# Patient Record
Sex: Male | Born: 1975 | Race: Black or African American | Hispanic: No | Marital: Married | Smoking: Never smoker
Health system: Southern US, Community
[De-identification: ages and names within clinical notes are randomized; demographics above are authoritative.]

## PROBLEM LIST (undated history)

## (undated) DIAGNOSIS — I10 Essential (primary) hypertension: Secondary | ICD-10-CM

## (undated) HISTORY — PX: NOSE SURGERY: SHX723

---

## 2015-07-16 ENCOUNTER — Emergency Department: Payer: BLUE CROSS/BLUE SHIELD

## 2015-07-16 ENCOUNTER — Emergency Department
Admission: EM | Admit: 2015-07-16 | Discharge: 2015-07-16 | Disposition: A | Discharge status: Home / Self Care | Payer: BLUE CROSS/BLUE SHIELD | Attending: Emergency Medicine | Admitting: Emergency Medicine

## 2015-07-16 ENCOUNTER — Encounter: Payer: Self-pay | Admitting: Emergency Medicine

## 2015-07-16 DIAGNOSIS — I10 Essential (primary) hypertension: Secondary | ICD-10-CM | POA: Diagnosis not present

## 2015-07-16 DIAGNOSIS — R079 Chest pain, unspecified: Secondary | ICD-10-CM

## 2015-07-16 HISTORY — DX: Essential (primary) hypertension: I10

## 2015-07-16 LAB — BASIC METABOLIC PANEL
ANION GAP: 10 (ref 5–15)
BUN: 15 mg/dL (ref 6–20)
CALCIUM: 9.4 mg/dL (ref 8.9–10.3)
CO2: 24 mmol/L (ref 22–32)
Chloride: 105 mmol/L (ref 101–111)
Creatinine, Ser: 0.82 mg/dL (ref 0.61–1.24)
GFR calc non Af Amer: >60 mL/min (ref >60)
Glucose, Bld: 98 mg/dL (ref 65–99)
POTASSIUM: 3.5 mmol/L (ref 3.5–5.1)
Sodium: 139 mmol/L (ref 135–145)

## 2015-07-16 LAB — CBC
HEMATOCRIT: 40.9 % (ref 40.0–52.0)
HEMOGLOBIN: 13.9 g/dL (ref 13.0–18.0)
MCH: 28 pg (ref 26.0–34.0)
MCHC: 33.9 g/dL (ref 32.0–36.0)
MCV: 82.5 fL (ref 80.0–100.0)
Platelets: 194 10*3/uL (ref 150–440)
RBC: 4.96 MIL/uL (ref 4.40–5.90)
RDW: 14.9 % — ABNORMAL HIGH (ref 11.5–14.5)
WBC: 7.2 10*3/uL (ref 3.8–10.6)

## 2015-07-16 LAB — TROPONIN I
TROPONIN I: 0.03 ng/mL (ref <0.031)
Troponin I: <0.03 ng/mL (ref <0.031)

## 2015-07-16 NOTE — ED Notes (Addendum)
Patient presents to the ED with left sided chest pain radiating down left arm and into left side of patient's jaw x 2 days but getting worse today per patient.  Patient denies shortness of breath.  Patient reports diaphoresis and some nausea, vomiting x 1.  Patient reports high stress over the past several months due to problems in his marriage.  Patient reports losing a lot of weight.

## 2015-07-16 NOTE — ED Provider Notes (Signed)
  Physical Exam  BP 153/95 mmHg  Pulse 69  Temp(Src) 98.7 F (37.1 C) (Oral)  Resp 18  Ht 5\' 6"  (1.676 m)  Wt 185 lb (83.915 kg)  BMI 29.87 kg/m2  SpO2 100%  Physical Exam  ED Course  Procedures  MDM Care assumed at 9 pm. Patient here with chest pain for several days. Delta trop neg. Will have him take ASA 81 mg daily and see Dr. Juliann Parescallwood outpatient.   Richardean Canalavid H Kimala Horne, MD 07/16/15 2103

## 2015-07-16 NOTE — Discharge Instructions (Signed)
Take ASA 81 mg daily,.   See cardiology outpatient.   Return to ER if you have severe chest pain, shortness of breath, numbness

## 2015-07-16 NOTE — ED Notes (Signed)
Called to check on status of labs

## 2015-07-16 NOTE — ED Provider Notes (Signed)
[facility] Emergency Department Provider Note     Time seen: ----------------------------------------- 6:53 PM on 07/16/2015 -----------------------------------------    I have reviewed the triage vital signs and the nursing notes.   HISTORY  Chief Complaint Chest Pain    HPI Dennis Santiago is a 40 y.o. male who presents to ER for left-sided chest pain and left shoulder pain. Patient is also had paresthesias radiating down his left arm for 2 days. This is gotten worse today, denies shortness of breath, but has had some diaphoresis with nausea and vomiting. He reports stress over the past several months due to problems with his marriage. Patient denies history of same, nothing makes it better or worse.   Past Medical History  Diagnosis Date  . Hypertension     There are no active problems to display for this patient.   History reviewed. No pertinent past surgical history.  Allergies Review of patient's allergies indicates no known allergies.  Social History Social History  Substance Use Topics  . Smoking status: Never Smoker   . Smokeless tobacco: None  . Alcohol Use: Yes     Comment: socially    Review of Systems Constitutional: Negative for fever. Eyes: Negative for visual changes. ENT: Negative for sore throat. Cardiovascular: Positive for chest pain Respiratory: Negative for shortness of breath. Gastrointestinal: Negative for abdominal pain, positive for nausea vomiting Genitourinary: Negative for dysuria. Musculoskeletal: Negative for back pain. Skin: Positive for diaphoresis Neurological: Negative for headaches, focal weakness or numbness.  10-point ROS otherwise negative.  ____________________________________________   PHYSICAL EXAM:  VITAL SIGNS: ED Triage Vitals  Enc Vitals Group     BP 07/16/15 1658 127/82 mmHg     Pulse Rate 07/16/15 1658 80     Resp 07/16/15 1658 20     Temp 07/16/15 1658 98.7 F (37.1 C)   Temp Source 07/16/15 1658 Oral     SpO2 07/16/15 1658 97 %     Weight 07/16/15 1658 185 lb (83.915 kg)     Height 07/16/15 1658 5\' 6"  (1.676 m)     Head Cir --      Peak Flow --      Pain Score 07/16/15 1659 7     Pain Loc --      Pain Edu? --      Excl. in GC? --    Constitutional: Alert and oriented. Well appearing and in no distress. Eyes: Conjunctivae are normal. PERRL. Normal extraocular movements. ENT   Head: Normocephalic and atraumatic.   Nose: No congestion/rhinnorhea.   Mouth/Throat: Mucous membranes are moist.   Neck: No stridor. Cardiovascular: Normal rate, regular rhythm. No murmurs, rubs, or gallops. Respiratory: Normal respiratory effort without tachypnea nor retractions. Breath sounds are clear and equal bilaterally. No wheezes/rales/rhonchi. Gastrointestinal: Soft and nontender. Normal bowel sounds Musculoskeletal: Nontender with normal range of motion in all extremities. No lower extremity tenderness nor edema. Neurologic:  Normal speech and language. No gross focal neurologic deficits are appreciated.  Skin:  Skin is warm, dry and intact. No rash noted. Psychiatric: Mood and affect are normal. Speech and behavior are normal.  ____________________________________________  EKG: Interpreted by me. Normal sinus rhythm rate 83 bpm, normal PR interval, normal QRS, normal QT intervals. Normal axis. No evidence of acute infarction  ____________________________________________  ED COURSE:  Pertinent labs & imaging results that were available during my care of the patient were reviewed by me and considered in my medical decision making (see chart for details). Patient is in  no acute distress, has a normal physical exam. Will check cardiac labs and reevaluate. ____________________________________________    LABS (pertinent positives/negatives)  Labs Reviewed  CBC - Abnormal; Notable for the following:    RDW 14.9 (*)    All other components within normal  limits  BASIC METABOLIC PANEL  TROPONIN I    RADIOLOGY Chest x-ray IMPRESSION: 1. No cause for chest pain identified. 2. Thoracic spondylosis. 3. Anterior right chest wall BB. ____________________________________________  FINAL ASSESSMENT AND PLAN  Chest pain  Plan: Patient with labs and imaging as dictated above. Patient is in no acute distress, repeat troponin is pending at this time. Patient care checked out to Dr.Yao. He is currently medically stable.   Emily Filbert, MD   Emily Filbert, MD 07/16/15 [phone]

## 2016-03-16 ENCOUNTER — Emergency Department: Payer: BLUE CROSS/BLUE SHIELD

## 2016-03-16 ENCOUNTER — Emergency Department
Admission: EM | Admit: 2016-03-16 | Discharge: 2016-03-17 | Disposition: A | Discharge status: Home / Self Care | Payer: BLUE CROSS/BLUE SHIELD | Attending: Emergency Medicine | Admitting: Emergency Medicine

## 2016-03-16 DIAGNOSIS — I10 Essential (primary) hypertension: Secondary | ICD-10-CM | POA: Diagnosis not present

## 2016-03-16 DIAGNOSIS — R079 Chest pain, unspecified: Secondary | ICD-10-CM | POA: Diagnosis present

## 2016-03-16 LAB — CBC
HCT: 39.4 % — ABNORMAL LOW (ref 40.0–52.0)
HEMOGLOBIN: 13.6 g/dL (ref 13.0–18.0)
MCH: 29.2 pg (ref 26.0–34.0)
MCHC: 34.4 g/dL (ref 32.0–36.0)
MCV: 84.9 fL (ref 80.0–100.0)
Platelets: 182 10*3/uL (ref 150–440)
RBC: 4.65 MIL/uL (ref 4.40–5.90)
RDW: 14.2 % (ref 11.5–14.5)
WBC: 7.5 10*3/uL (ref 3.8–10.6)

## 2016-03-16 MED ORDER — SUCRALFATE 1 G PO TABS
ORAL_TABLET | ORAL | Status: AC
  Administered 2016-03-16: 1 g via ORAL
  Filled 2016-03-16: qty 1

## 2016-03-16 MED ORDER — GI COCKTAIL ~~LOC~~
30.0000 mL | Freq: Once | ORAL | Status: AC
Start: 2016-03-16 — End: 2016-03-16
  Administered 2016-03-16: 30 mL via ORAL
  Filled 2016-03-16: qty 30

## 2016-03-16 MED ORDER — SUCRALFATE 1 G PO TABS
1.0000 g | ORAL_TABLET | Freq: Three times a day (TID) | ORAL | Status: DC
  Administered 2016-03-16: 1 g via ORAL

## 2016-03-16 MED ORDER — ASPIRIN 81 MG PO CHEW
324.0000 mg | CHEWABLE_TABLET | Freq: Once | ORAL | Status: AC
  Administered 2016-03-16: 324 mg via ORAL
  Filled 2016-03-16: qty 4

## 2016-03-16 NOTE — ED Notes (Signed)
ED Provider at bedside. 

## 2016-03-16 NOTE — ED Triage Notes (Signed)
Pt in with co shob and cp since this am, hx of the same but all tests were wnl.

## 2016-03-17 LAB — COMPREHENSIVE METABOLIC PANEL
ALT: 28 U/L (ref 17–63)
AST: 24 U/L (ref 15–41)
Albumin: 3.9 g/dL (ref 3.5–5.0)
Alkaline Phosphatase: 61 U/L (ref 38–126)
Anion gap: 7 (ref 5–15)
BILIRUBIN TOTAL: 0.7 mg/dL (ref 0.3–1.2)
BUN: 15 mg/dL (ref 6–20)
CO2: 26 mmol/L (ref 22–32)
CREATININE: 0.92 mg/dL (ref 0.61–1.24)
Calcium: 8.9 mg/dL (ref 8.9–10.3)
Chloride: 106 mmol/L (ref 101–111)
GFR calc Af Amer: >60 mL/min (ref >60)
Glucose, Bld: 105 mg/dL — ABNORMAL HIGH (ref 65–99)
Potassium: 3.3 mmol/L — ABNORMAL LOW (ref 3.5–5.1)
Sodium: 139 mmol/L (ref 135–145)
TOTAL PROTEIN: 6.9 g/dL (ref 6.5–8.1)

## 2016-03-17 LAB — FIBRIN DERIVATIVES D-DIMER (ARMC ONLY): Fibrin derivatives D-dimer (ARMC): 236 (ref 0–499)

## 2016-03-17 LAB — LIPASE, BLOOD: LIPASE: 24 U/L (ref 11–51)

## 2016-03-17 LAB — TROPONIN I
Troponin I: <0.03 ng/mL (ref <0.03)
Troponin I: <0.03 ng/mL (ref <0.03)

## 2016-03-17 MED ORDER — RANITIDINE HCL 150 MG PO TABS
150.0000 mg | ORAL_TABLET | Freq: Two times a day (BID) | ORAL | 0 refills | Status: DC
Start: 2016-03-17 — End: 2019-09-05

## 2016-03-17 MED ORDER — SUCRALFATE 1 G PO TABS: 1.0000 g | ORAL_TABLET | Freq: Two times a day (BID) | ORAL | 0 refills | Status: DC

## 2016-03-17 NOTE — ED Notes (Signed)
ED Provider at bedside. 

## 2016-03-17 NOTE — ED Provider Notes (Signed)
[facility] Emergency Department Provider Note   ____________________________________________   First MD Initiated Contact with Patient 03/16/16 2323     (approximate)  I have reviewed the triage vital signs and the nursing notes.   HISTORY  Chief Complaint Chest Pain    HPI Dennis Santiago is a 40 y.o. male who comes into the hospital today with chest pain. The pain started this morning. The patient was drinking coffee at the time and started hurting in the middle of his chest. The patient initially denied radiation but then stated that it was going into his neck all over. He did not take anything for pain. He reports though that whenever he's eaten all day it has hurt to go down and it hurts in his chest. He's had some mild shortness of breath and some pain with deep breathing. The patient also endorses a headache. He's felt a little dizzy but has had no nausea or vomiting. The patient rates his pain a 7 out of 10 in intensity. He reports that it just a little sharp. The patient has had this pain before and was told that everything was okay. He was told to take it easy and that it could've been a heart attack. He has not followed up with anyone. The patient was concerned he decided to come in to the hospital today.   Past Medical History:  Diagnosis Date  . Hypertension     There are no active problems to display for this patient.   Past Surgical History:  Procedure Laterality Date  . NOSE SURGERY      Prior to Admission medications   Medication Sig Start Date End Date Taking? Authorizing Provider  ranitidine (ZANTAC) 150 MG tablet Take 1 tablet (150 mg total) by mouth 2 (two) times daily. 03/17/16   Rebecka ApleyAllison P Webster, MD  sucralfate (CARAFATE) 1 g tablet Take 1 tablet (1 g total) by mouth 2 (two) times daily. 03/17/16   Rebecka ApleyAllison P Webster, MD    Allergies Patient has no known allergies.  No family history on file.  Social History Social History   Substance Use Topics  . Smoking status: Never Smoker  . Smokeless tobacco: Not on file  . Alcohol use Yes     Comment: socially    Review of Systems Constitutional: No fever/chills Eyes: No visual changes. ENT: No sore throat. Cardiovascular:  chest pain. Respiratory:  shortness of breath. Gastrointestinal: No abdominal pain.  No nausea, no vomiting.  No diarrhea.  No constipation. Genitourinary: Negative for dysuria. Musculoskeletal: Negative for back pain. Skin: Negative for rash. Neurological: Headache  10-point ROS otherwise negative.  ____________________________________________   PHYSICAL EXAM:  VITAL SIGNS: ED Triage Vitals  Enc Vitals Group     BP 03/16/16 2315 137/81     Pulse Rate 03/16/16 2315 85     Resp 03/16/16 2315 (!) 25     Temp 03/16/16 2316 98.6 F (37 C)     Temp Source 03/16/16 2316 Oral     SpO2 03/16/16 2315 96 %     Weight 03/16/16 2309 210 lb (95.3 kg)     Height 03/16/16 2309 5\' 6"  (1.676 m)     Head Circumference --      Peak Flow --      Pain Score 03/16/16 2310 7     Pain Loc --      Pain Edu? --      Excl. in GC? --     Constitutional: Alert  and oriented. Well appearing and in Mild distress. Eyes: Conjunctivae are normal. PERRL. EOMI. Head: Atraumatic. Nose: No congestion/rhinnorhea. Mouth/Throat: Mucous membranes are moist.  Oropharynx non-erythematous. Cardiovascular: Normal rate, regular rhythm. Grossly normal heart sounds.  Good peripheral circulation. Respiratory: Normal respiratory effort.  No retractions. Lungs CTAB. Gastrointestinal: Soft and nontender. No distention. Positive bowel sounds Musculoskeletal: No lower extremity tenderness nor edema.   Neurologic:  Normal speech and language.  Skin:  Skin is warm, dry and intact.  Psychiatric: Mood and affect are normal.   ____________________________________________   LABS (all labs ordered are listed, but only abnormal results are displayed)  Labs Reviewed  CBC -  Abnormal; Notable for the following:       Result Value   HCT 39.4 (*)    All other components within normal limits  COMPREHENSIVE METABOLIC PANEL - Abnormal; Notable for the following:    Potassium 3.3 (*)    Glucose, Bld 105 (*)    All other components within normal limits  LIPASE, BLOOD  TROPONIN I  FIBRIN DERIVATIVES D-DIMER ([facility] ONLY)  TROPONIN I   ____________________________________________  EKG  ED ECG REPORT I, Rebecka Apley, the attending physician, personally viewed and interpreted this ECG.   Date: 03/16/2016  EKG Time: 2313  Rate: 83  Rhythm: normal sinus rhythm  Axis: normal  Intervals:none  ST&T Change: T wave inversion in lead 3 and aVF seen previously on EKG from April 2017  ____________________________________________  RADIOLOGY  CXR ____________________________________________   PROCEDURES  Procedure(s) performed: None  Procedures  Critical Care performed: No  ____________________________________________   INITIAL IMPRESSION / ASSESSMENT AND PLAN / ED COURSE  Pertinent labs & imaging results that were available during my care of the patient were reviewed by me and considered in my medical decision making (see chart for details).  This is a 40 year old male who comes into the hospital today with some chest pain. The patient has had this all day and he reports that it seems worse when he is eating. The patient's EKG does show some flipped T waves but he has had this previously. The patient's initial blood work was unremarkable including a d-dimer which was checked. I did give the patient a GI cocktail and some Carafate and I will repeat his troponin. I will reassess the patient once he received his medications.  Clinical Course as of Mar 18 399  Wed Mar 17, 2016  0153 No radiographic evidence for acute cardiopulmonary abnormality. DG Chest 2 View [AW]    Clinical Course User Index [AW] Rebecka Apley, MD   The patient's pain is  improved at this time. He is sleeping comfortably on the stretcher. I did repeat the patient's troponin and it was negative. I feel the patient's chest pain may have been due to gastritis or reflux given the circumstances which made his pain worse. The patient be discharged home to follow-up with the acute care clinic as well as cardiology. The patient has no further complaints or concerns and he will be discharged.  ____________________________________________   FINAL CLINICAL IMPRESSION(S) / ED DIAGNOSES  Final diagnoses:  Chest pain, unspecified type      NEW MEDICATIONS STARTED DURING THIS VISIT:  New Prescriptions   RANITIDINE (ZANTAC) 150 MG TABLET    Take 1 tablet (150 mg total) by mouth 2 (two) times daily.   SUCRALFATE (CARAFATE) 1 G TABLET    Take 1 tablet (1 g total) by mouth 2 (two) times daily.  Note:  This document was prepared using Dragon voice recognition software and may include unintentional dictation errors.    Rebecka ApleyAllison P Webster, MD 03/17/16 0400

## 2016-05-20 ENCOUNTER — Emergency Department: Payer: Self-pay

## 2016-05-20 ENCOUNTER — Encounter: Payer: Self-pay | Admitting: *Deleted

## 2016-05-20 ENCOUNTER — Emergency Department
Admission: EM | Admit: 2016-05-20 | Discharge: 2016-05-20 | Disposition: A | Discharge status: Home / Self Care | Payer: Self-pay | Attending: Emergency Medicine | Admitting: Emergency Medicine

## 2016-05-20 DIAGNOSIS — J181 Lobar pneumonia, unspecified organism: Secondary | ICD-10-CM | POA: Insufficient documentation

## 2016-05-20 DIAGNOSIS — I10 Essential (primary) hypertension: Secondary | ICD-10-CM | POA: Insufficient documentation

## 2016-05-20 DIAGNOSIS — J111 Influenza due to unidentified influenza virus with other respiratory manifestations: Secondary | ICD-10-CM | POA: Insufficient documentation

## 2016-05-20 DIAGNOSIS — J189 Pneumonia, unspecified organism: Secondary | ICD-10-CM

## 2016-05-20 MED ORDER — AZITHROMYCIN 500 MG PO TABS
500.0000 mg | ORAL_TABLET | Freq: Once | ORAL | Status: AC
  Administered 2016-05-20: 500 mg via ORAL

## 2016-05-20 MED ORDER — CEFTRIAXONE SODIUM 1 G IJ SOLR
INTRAMUSCULAR | Status: AC
  Administered 2016-05-20: 1 g via INTRAMUSCULAR
  Filled 2016-05-20: qty 10

## 2016-05-20 MED ORDER — BENZONATATE 100 MG PO CAPS
200.0000 mg | ORAL_CAPSULE | Freq: Once | ORAL | Status: AC
  Administered 2016-05-20: 200 mg via ORAL
  Filled 2016-05-20: qty 2

## 2016-05-20 MED ORDER — AZITHROMYCIN 500 MG PO TABS
ORAL_TABLET | ORAL | Status: AC
  Administered 2016-05-20: 500 mg via ORAL
  Filled 2016-05-20: qty 1

## 2016-05-20 MED ORDER — ACETAMINOPHEN 500 MG PO TABS
ORAL_TABLET | ORAL | Status: AC
  Filled 2016-05-20: qty 2

## 2016-05-20 MED ORDER — CEFTRIAXONE SODIUM 1 G IJ SOLR
1.0000 g | Freq: Once | INTRAMUSCULAR | Status: AC
  Administered 2016-05-20: 1 g via INTRAMUSCULAR

## 2016-05-20 MED ORDER — AZITHROMYCIN 250 MG PO TABS: ORAL_TABLET | ORAL | 0 refills | Status: DC

## 2016-05-20 MED ORDER — ACETAMINOPHEN 500 MG PO TABS
1000.0000 mg | ORAL_TABLET | Freq: Once | ORAL | Status: AC
Start: 2016-05-20 — End: 2016-05-20
  Administered 2016-05-20: 1000 mg via ORAL

## 2016-05-20 NOTE — ED Provider Notes (Signed)
[facility] Emergency Department Provider Note  ____________________________________________  Time seen: Approximately 11:06 PM  I have reviewed the triage vital signs and the nursing notes.   HISTORY  Chief Complaint Influenza    HPI Dennis Santiago is a 41 y.o. male presenting to the emergency department with rhinorrhea, headache, congestion, myalgias, productive cough, sweats, chills and increased sleep for approximately two weeks. Patient states that he has also had shortness of breath, purulent sputum production, and fatigue for the past. Patient states that he feels like his symptoms are worsening. He is tolerating fluids by mouth. He has had diminished appetite. Patient has been taking ibuprofen for fever. He denies chest pain, chest tightness, dysuria, hematuria, diarrhea, abdominal cramping and vomiting. Patient works as a Comptrollerkitchen manager.   Past Medical History:  Diagnosis Date  . Hypertension     There are no active problems to display for this patient.   Past Surgical History:  Procedure Laterality Date  . NOSE SURGERY      Prior to Admission medications   Medication Sig Start Date End Date Taking? Authorizing Provider  azithromycin (ZITHROMAX) 250 MG tablet Take 1 tablet daily for four days. 05/20/16   Orvil FeilJaclyn M Shadia Larose, PA-C  ranitidine (ZANTAC) 150 MG tablet Take 1 tablet (150 mg total) by mouth 2 (two) times daily. 03/17/16   Rebecka ApleyAllison P Webster, MD  sucralfate (CARAFATE) 1 g tablet Take 1 tablet (1 g total) by mouth 2 (two) times daily. 03/17/16   Rebecka ApleyAllison P Webster, MD    Allergies Patient has no known allergies.  No family history on file.  Social History Social History  Substance Use Topics  . Smoking status: Never Smoker  . Smokeless tobacco: Never Used  . Alcohol use Yes     Comment: socially    Review of Systems  Constitutional: Patient has had fever.  Eyes: No visual changes. No discharge ENT: Patient has had congestion.   Cardiovascular: no chest pain. Respiratory: Patient has had Productive cough, purulent sputum production and shortness of breath. Gastrointestinal: Patient denies nausea, vomiting and diarrhea. Genitourinary: Negative for dysuria. No hematuria Musculoskeletal: Patient has had myalgias. Skin: Negative for rash, abrasions, lacerations, ecchymosis. Neurological: Patient has had headache, no focal weakness or numbness.  ____________________________________________   PHYSICAL EXAM:  VITAL SIGNS: ED Triage Vitals  Enc Vitals Group     BP 05/20/16 2029 135/76     Pulse Rate 05/20/16 2029 (!) 113     Resp 05/20/16 2029 (!) 22     Temp 05/20/16 2029 (!) 102.7 F (39.3 C)     Temp Source 05/20/16 2029 Oral     SpO2 05/20/16 2029 99 %     Weight 05/20/16 2031 210 lb (95.3 kg)     Height 05/20/16 2031 5\' 6"  (1.676 m)     Head Circumference --      Peak Flow --      Pain Score 05/20/16 2031 8     Pain Loc --      Pain Edu? --      Excl. in GC? --      Constitutional: Alert and oriented. Patient is lying supine in bed. He struggles to sit upright. He appears ill Eyes: Conjunctivae are normal. PERRL. EOMI. Head: Atraumatic. ENT:      Ears: Tympanic membranes are injected bilaterally without evidence of effusion or purulent exudate. Bony landmarks are visualized bilaterally. No pain with palpation at the tragus.      Nose: Nasal turbinates are  edematous and erythematous. Copious rhinorrhea visualized.      Mouth/Throat: Mucous membranes are moist. Posterior pharynx is mildly erythematous. No tonsillar hypertrophy or purulent exudate. Uvula is midline. Neck: Full range of motion. No pain is elicited with flexion at the neck. Hematological/Lymphatic/Immunilogical: No cervical lymphadenopathy. Cardiovascular: Normal rate, regular rhythm. Normal S1 and S2.  Good peripheral circulation. Respiratory: Normal respiratory effort without tachypnea or retractions. Lungs CTAB. Good air entry to the  bases with no decreased or absent breath sounds. Gastrointestinal: Bowel sounds 4 quadrants. Soft and nontender to palpation. No guarding or rigidity. No palpable masses. No distention. No CVA tenderness.  Skin:  Skin is warm, dry and intact. No rash noted. Psychiatric: Mood and affect are normal. Speech and behavior are normal. Patient exhibits appropriate insight and judgement.  ____________________________________________   LABS (all labs ordered are listed, but only abnormal results are displayed)  Labs Reviewed - No data to display ____________________________________________  EKG   ____________________________________________  RADIOLOGY Geraldo Pitter, personally viewed and evaluated these images (plain radiographs) as part of my medical decision making, as well as reviewing the written report by the radiologist.    Dg Chest 2 View  Result Date: 05/20/2016 CLINICAL DATA:  Cough and body aches EXAM: CHEST  2 VIEW COMPARISON:  03/16/2016 FINDINGS: The right lung is grossly clear. There is a patchy infiltrate at the left lung base. Probable tiny left effusion. Right lung clear. Normal heart size. No pneumothorax. Metallic BB over the right upper chest. IMPRESSION: Left lower lobe pneumonia with possible small effusion Electronically Signed   By: Jasmine Pang M.D.   On: 05/20/2016 23:15    ____________________________________________    PROCEDURES  Procedure(s) performed:    Procedures    Medications  acetaminophen (TYLENOL) tablet 1,000 mg (1,000 mg Oral Given 05/20/16 2034)  benzonatate (TESSALON) capsule 200 mg (200 mg Oral Given 05/20/16 2313)  cefTRIAXone (ROCEPHIN) injection 1 g (1 g Intramuscular Given 05/20/16 2334)  azithromycin (ZITHROMAX) tablet 500 mg (500 mg Oral Given 05/20/16 2333)     ____________________________________________   INITIAL IMPRESSION / ASSESSMENT AND PLAN / ED COURSE  Pertinent labs & imaging results that were available during  my care of the patient were reviewed by me and considered in my medical decision making (see chart for details).  Review of the [place] CSRS was performed in accordance of the [facility] prior to dispensing any controlled drugs.    Assessment and plan: Influenza Community Acquired Pneumonia  Patient presents to the emergency department with rhinorrhea, headache, congestion, myalgias, productive cough, sweats, chills and increased sleep for approximately two weeks. Symptoms are consistent with influenza. Patient also had shortness of breath, fatigue and purulent sputum production, symptoms consistent with community-acquired pneumonia. DG chest conducted in the emergency department indicates right lower lobe pneumonia. Patient was given ceftriaxone and azithromycin in the emergency department. He was discharged with azithromycin. Patient's vital signs are reassuring at this time. He does not meet admission criteria at this time. He will be treated as an outpatient. Strict return precautions were given to patient twice. He was advised to follow-up with his primary care provider in one week. All patient questions were answered.  ____________________________________________  FINAL CLINICAL IMPRESSION(S) / ED DIAGNOSES  Final diagnoses:  Influenza  Community acquired pneumonia of right lower lobe of lung (HCC)      NEW MEDICATIONS STARTED DURING THIS VISIT:  New Prescriptions   AZITHROMYCIN (ZITHROMAX) 250 MG TABLET    Take 1 tablet daily for  four days.        This chart was dictated using voice recognition software/Dragon. Despite best efforts to proofread, errors can occur which can change the meaning. Any change was purely unintentional.    Orvil Feil, PA-C 05/20/16 2343    Governor Rooks, MD 05/22/16 [phone]

## 2016-05-20 NOTE — ED Triage Notes (Signed)
Pt to triage via wheelchair.  Pt has a cough, bodyaches , fever for 2 weeks.  Nonsmoker.  Pt alert.

## 2016-05-20 NOTE — ED Notes (Signed)
Pt c/o flu like symptoms x2 weeks; pt states having fever, body aches, unproductive coughing, sweats and chills; pt is alert and oriented x4, able to speak in full sentences, has clear breath sounds bilaterally in all fields, peripheral pulses are moderate with cap refills <3 secs.

## 2017-08-06 ENCOUNTER — Emergency Department
Admission: EM | Admit: 2017-08-06 | Discharge: 2017-08-06 | Disposition: A | Discharge status: Home / Self Care | Payer: BLUE CROSS/BLUE SHIELD | Attending: Emergency Medicine | Admitting: Emergency Medicine

## 2017-08-06 ENCOUNTER — Encounter: Payer: Self-pay | Admitting: Emergency Medicine

## 2017-08-06 DIAGNOSIS — I10 Essential (primary) hypertension: Secondary | ICD-10-CM | POA: Insufficient documentation

## 2017-08-06 DIAGNOSIS — Z79899 Other long term (current) drug therapy: Secondary | ICD-10-CM | POA: Insufficient documentation

## 2017-08-06 DIAGNOSIS — K0889 Other specified disorders of teeth and supporting structures: Secondary | ICD-10-CM | POA: Insufficient documentation

## 2017-08-06 MED ORDER — PENICILLIN V POTASSIUM 500 MG PO TABS: 500.0000 mg | ORAL_TABLET | Freq: Four times a day (QID) | ORAL | 0 refills | Status: DC

## 2017-08-06 MED ORDER — PENICILLIN V POTASSIUM 250 MG PO TABS
500.0000 mg | ORAL_TABLET | Freq: Once | ORAL | Status: AC
  Administered 2017-08-06: 500 mg via ORAL
  Filled 2017-08-06: qty 2

## 2017-08-06 MED ORDER — OXYCODONE-ACETAMINOPHEN 5-325 MG PO TABS
1.0000 | ORAL_TABLET | Freq: Once | ORAL | Status: AC
  Administered 2017-08-06: 1 via ORAL
  Filled 2017-08-06: qty 1

## 2017-08-06 MED ORDER — OXYCODONE-ACETAMINOPHEN 5-325 MG PO TABS: 1.0000 | ORAL_TABLET | Freq: Four times a day (QID) | ORAL | 0 refills | Status: DC | PRN

## 2017-08-06 NOTE — ED Triage Notes (Signed)
Pt co dental pain in bottom lower left molar area, reddened and swollen.  Pt states it has been a problem since last week.  He rates his pain at 8/10.

## 2017-08-06 NOTE — ED Provider Notes (Signed)
[facility] Emergency Department Provider Note   ____________________________________________   First MD Initiated Contact with Patient 08/06/17 [phone]     (approximate)  I have reviewed the triage vital signs and the nursing notes.   HISTORY  Chief Complaint Dental Pain (Left lower back molar)    HPI Dennis Santiago is a 42 y.o. male who reports about a week of pain and swelling and redness in the left lower jaw around one of the teeth there. Reports the pain comes and goes. He's been trying to clean it with peroxide and Listerine. He is not running a fever and the pain is not spreading.the pain was moderately severe earlier right now it is gone.   Past Medical History:  Diagnosis Date  . Hypertension     There are no active problems to display for this patient.   Past Surgical History:  Procedure Laterality Date  . NOSE SURGERY      Prior to Admission medications   Medication Sig Start Date End Date Taking? Authorizing Provider  azithromycin (ZITHROMAX) 250 MG tablet Take 1 tablet daily for four days. 05/20/16   Orvil Feil, PA-C  oxyCODONE-acetaminophen (PERCOCET/ROXICET) 5-325 MG tablet Take 1-2 tablets by mouth every 6 (six) hours as needed for severe pain. 08/06/17   Arnaldo Natal, MD  penicillin v potassium (VEETID) 500 MG tablet Take 1 tablet (500 mg total) by mouth 4 (four) times daily. 08/06/17   Arnaldo Natal, MD  ranitidine (ZANTAC) 150 MG tablet Take 1 tablet (150 mg total) by mouth 2 (two) times daily. 03/17/16   Rebecka Apley, MD  sucralfate (CARAFATE) 1 g tablet Take 1 tablet (1 g total) by mouth 2 (two) times daily. 03/17/16   Rebecka Apley, MD    Allergies Patient has no known allergies.  No family history on file.  Social History Social History   Tobacco Use  . Smoking status: Never Smoker  . Smokeless tobacco: Never Used  Substance Use Topics  . Alcohol use: Yes    Comment: socially  . Drug use: Never     Review of Systems Constitutional: No fever/chills Eyes: No visual changes. ENT: No sore throat. Cardiovascular: Denies chest pain. Respiratory: Denies shortness of breath. Gastrointestinal: No abdominal pain.  No nausea, no vomiting.  No diarrhea.  No constipation. Genitourinary: Negative for dysuria. Musculoskeletal: Negative for back pain. Skin: Negative for rash. Neurological: Negative for headaches, focal weakness   ____________________________________________   PHYSICAL EXAM:  VITAL SIGNS: ED Triage Vitals [08/06/17 0114]  Enc Vitals Group     BP (!) 143/70     Pulse Rate 88     Resp 18     Temp 98.1 F (36.7 C)     Temp Source Oral     SpO2 97 %     Weight 215 lb (97.5 kg)     Height  (1.676 m)     Head Circumference      Peak Flow      Pain Score 8     Pain Loc      Pain Edu?      Excl. in GC?     Constitutional: Alert and oriented. Well appearing and in no acute distress. Eyes: Conjunctivae are normal.  Head: Atraumatic. Nose: No congestion/rhinnorhea. Mouth/Throat: Mucous membranes are moist.  Oropharynx non-erythematous.none of the teeth are tender to percussion. There does appear to be some swelling about the second to the right molar on the left but  is not tender at this time. Neck: No stridor.   Hematological/Lymphatic/Immunilogical: No cervical lymphadenopathy. Cardiovascular: Normal rate, regular rhythm. Grossly normal heart sounds.  Good peripheral circulation. Respiratory: Normal respiratory effort.  No retractions. Lungs CTAB. Neurologic:  Normal speech and language. No gross focal neurologic deficits are appreciated. No gait instability. Skin:  Skin is warm, dry and intact. No rash noted. Psychiatric: Mood and affect are normal. Speech and behavior are normal.  ____________________________________________   LABS (all labs ordered are listed, but only abnormal results are displayed)  Labs Reviewed - No data to  display ____________________________________________  EKG   ____________________________________________  RADIOLOGY  ED MD interpretat  Official radiology report(s): No results found.  ____________________________________________   PROCEDURES  Procedure(s) performed:   Procedures  Critical Care performed:   ____________________________________________   INITIAL IMPRESSION / ASSESSMENT AND PLAN / ED COURSE  patient with toothache we'll treat him with some penicillin and pain medicine and refer him to one of the reduced cost dental clinics. He does not have insurance and is pending Medicaid         ____________________________________________   FINAL CLINICAL IMPRESSION(S) / ED DIAGNOSES  Final diagnoses:  Pain, dental     ED Discharge Orders        Ordered    penicillin v potassium (VEETID) 500 MG tablet  4 times daily     08/06/17 0451    oxyCODONE-acetaminophen (PERCOCET/ROXICET) 5-325 MG tablet  Every 6 hours PRN     08/06/17 0451       Note:  This document was prepared using Dragon voice recognition software and may include unintentional dictation errors.     Arnaldo Natal, MD 08/06/17 [phone]

## 2017-08-06 NOTE — ED Notes (Addendum)
Pt here with tooth pain for the last 48 hours, approx #18 appears swollen at gumline but intact, denies poor PO, pt reports some hx of poor dention and no primary dental care  Pt O and A x 4, NAD, no breathing difficulty  Pt alone att awaiting arrival of friend to receive meds

## 2018-10-01 ENCOUNTER — Telehealth: Payer: Self-pay | Admitting: Family

## 2018-10-01 DIAGNOSIS — J069 Acute upper respiratory infection, unspecified: Secondary | ICD-10-CM

## 2018-10-01 MED ORDER — BENZONATATE 100 MG PO CAPS: 100.0000 mg | ORAL_CAPSULE | Freq: Two times a day (BID) | ORAL | 0 refills | Status: AC | PRN

## 2018-10-01 MED ORDER — FLUTICASONE PROPIONATE 50 MCG/ACT NA SUSP: 2.0000 | Freq: Every day | NASAL | 0 refills | Status: AC

## 2018-10-01 NOTE — Progress Notes (Signed)
We are sorry you are not feeling well.  Here is how we plan to help!  Based on what you have shared with me, it looks like you may have a viral upper respiratory infection.  Upper respiratory infections are caused by a large number of viruses; however, rhinovirus is the most common cause.   This is not likely COVID-19.   Symptoms vary from person to person, with common symptoms including sore throat, cough, and fatigue or lack of energy.  A low-grade fever of up to 100.4 may present, but is often uncommon.  Symptoms vary however, and are closely related to a person's age or underlying illnesses.  The most common symptoms associated with an upper respiratory infection are nasal discharge or congestion, cough, sneezing, headache and pressure in the ears and face.  These symptoms usually persist for about 3 to 10 days, but can last up to 2 weeks.  It is important to know that upper respiratory infections do not cause serious illness or complications in most cases.    Upper respiratory infections can be transmitted from person to person, with the most common method of transmission being a person's hands.  The virus is able to live on the skin and can infect other persons for up to 2 hours after direct contact.  Also, these can be transmitted when someone coughs or sneezes; thus, it is important to cover the mouth to reduce this risk.  To keep the spread of the illness at bay, good hand hygiene is very important.  This is an infection that is most likely caused by a virus. There are no specific treatments other than to help you with the symptoms until the infection runs its course.  We are sorry you are not feeling well.  Here is how we plan to help!   For nasal congestion, you may use an oral decongestants such as Mucinex D or if you have glaucoma or high blood pressure use plain Mucinex.  Saline nasal spray or nasal drops can help and can safely be used as often as needed for congestion.  For your  congestion, I have prescribed Fluticasone nasal spray one spray in each nostril twice a day  If you do not have a history of heart disease, hypertension, diabetes or thyroid disease, prostate/bladder issues or glaucoma, you may also use Sudafed to treat nasal congestion.  It is highly recommended that you consult with a pharmacist or your primary care physician to ensure this medication is safe for you to take.     If you have a cough, you may use cough suppressants such as Delsym and Robitussin.  If you have glaucoma or high blood pressure, you can also use Coricidin HBP.   For cough I have prescribed for you A prescription cough medication called Tessalon Perles 100 mg. You may take 1-2 capsules every 8 hours as needed for cough  If you have a sore or scratchy throat, use a saltwater gargle-  to  teaspoon of salt dissolved in a 4-ounce to 8-ounce glass of warm water.  Gargle the solution for approximately 15-30 seconds and then spit.  It is important not to swallow the solution.  You can also use throat lozenges/cough drops and Chloraseptic spray to help with throat pain or discomfort.  Warm or cold liquids can also be helpful in relieving throat pain.  For headache, pain or general discomfort, you can use Ibuprofen or Tylenol as directed.   Some authorities believe that zinc sprays or  the use of Echinacea may shorten the course of your symptoms.   HOME CARE . Only take medications as instructed by your medical team. . Be sure to drink plenty of fluids. Water is fine as well as fruit juices, sodas and electrolyte beverages. You may want to stay away from caffeine or alcohol. If you are nauseated, try taking small sips of liquids. How do you know if you are getting enough fluid? Your urine should be a pale yellow or almost colorless. . Get rest. . Taking a steamy shower or using a humidifier may help nasal congestion and ease sore throat pain. You can place a towel over your head and breathe in  the steam from hot water coming from a faucet. . Using a saline nasal spray works much the same way. . Cough drops, hard candies and sore throat lozenges may ease your cough. . Avoid close contacts especially the very young and the elderly . Cover your mouth if you cough or sneeze . Always remember to wash your hands.   GET HELP RIGHT AWAY IF: . You develop worsening fever. . If your symptoms do not improve within 10 days . You develop yellow or green discharge from your nose over 3 days. . You have coughing fits . You develop a severe head ache or visual changes. . You develop shortness of breath, difficulty breathing or start having chest pain . Your symptoms persist after you have completed your treatment plan  MAKE SURE YOU   Understand these instructions.  Will watch your condition.  Will get help right away if you are not doing well or get worse.  Your e-visit answers were reviewed by a board certified advanced clinical practitioner to complete your personal care plan. Depending upon the condition, your plan could have included both over the counter or prescription medications. Please review your pharmacy choice. If there is a problem, you may call our nursing hot line at and have the prescription routed to another pharmacy. Your safety is important to [place]. If you have drug allergies check your prescription carefully.   You can use MyChart to ask questions about today's visit, request a non-urgent call back, or ask for a work or school excuse for 24 hours related to this e-Visit. If it has been greater than 24 hours you will need to follow up with your provider, or enter a new e-Visit to address those concerns. You will get an e-mail in the next two days asking about your experience.  I hope that your e-visit has been valuable and will speed your recovery. Thank you for using e-visits.     Greater than 5 minutes, yet less than 10 minutes of time have been spent researching,  coordinating, and implementing care for this patient today.  Thank you for the details you included in the comment boxes. Those details are very helpful in determining the best course of treatment for you and help [place] to provide the best care.

## 2018-10-07 ENCOUNTER — Encounter: Payer: Self-pay | Admitting: Emergency Medicine

## 2018-10-07 ENCOUNTER — Emergency Department: Payer: BC Managed Care – PPO

## 2018-10-07 ENCOUNTER — Other Ambulatory Visit: Payer: Self-pay

## 2018-10-07 ENCOUNTER — Emergency Department
Admission: EM | Admit: 2018-10-07 | Discharge: 2018-10-07 | Disposition: A | Discharge status: Home / Self Care | Payer: BC Managed Care – PPO | Attending: Emergency Medicine | Admitting: Emergency Medicine

## 2018-10-07 DIAGNOSIS — J129 Viral pneumonia, unspecified: Secondary | ICD-10-CM

## 2018-10-07 DIAGNOSIS — I10 Essential (primary) hypertension: Secondary | ICD-10-CM | POA: Insufficient documentation

## 2018-10-07 DIAGNOSIS — J1289 Other viral pneumonia: Secondary | ICD-10-CM | POA: Insufficient documentation

## 2018-10-07 DIAGNOSIS — U071 COVID-19: Secondary | ICD-10-CM | POA: Diagnosis not present

## 2018-10-07 DIAGNOSIS — R05 Cough: Secondary | ICD-10-CM | POA: Diagnosis present

## 2018-10-07 DIAGNOSIS — Z79899 Other long term (current) drug therapy: Secondary | ICD-10-CM | POA: Diagnosis not present

## 2018-10-07 LAB — CBC WITH DIFFERENTIAL/PLATELET
Abs Immature Granulocytes: 0.01 10*3/uL (ref 0.00–0.07)
Basophils Absolute: 0 10*3/uL (ref 0.0–0.1)
Basophils Relative: 0 %
Eosinophils Absolute: 0 10*3/uL (ref 0.0–0.5)
Eosinophils Relative: 1 %
HCT: 40.6 % (ref 39.0–52.0)
Hemoglobin: 13.5 g/dL (ref 13.0–17.0)
Immature Granulocytes: 0 %
Lymphocytes Relative: 25 %
Lymphs Abs: 1.6 10*3/uL (ref 0.7–4.0)
MCH: 27.6 pg (ref 26.0–34.0)
MCHC: 33.3 g/dL (ref 30.0–36.0)
MCV: 82.9 fL (ref 80.0–100.0)
Monocytes Absolute: 0.4 10*3/uL (ref 0.1–1.0)
Monocytes Relative: 6 %
Neutro Abs: 4.2 10*3/uL (ref 1.7–7.7)
Neutrophils Relative %: 68 %
Platelets: 194 10*3/uL (ref 150–400)
RBC: 4.9 MIL/uL (ref 4.22–5.81)
RDW: 14.6 % (ref 11.5–15.5)
WBC: 6.2 10*3/uL (ref 4.0–10.5)
nRBC: 0 % (ref 0.0–0.2)

## 2018-10-07 LAB — TRIGLYCERIDES: Triglycerides: 82 mg/dL (ref <150)

## 2018-10-07 LAB — COMPREHENSIVE METABOLIC PANEL
ALT: 18 U/L (ref 0–44)
AST: 21 U/L (ref 15–41)
Albumin: 3.7 g/dL (ref 3.5–5.0)
Alkaline Phosphatase: 69 U/L (ref 38–126)
Anion gap: 10 (ref 5–15)
BUN: 11 mg/dL (ref 6–20)
CO2: 24 mmol/L (ref 22–32)
Calcium: 8.7 mg/dL — ABNORMAL LOW (ref 8.9–10.3)
Chloride: 104 mmol/L (ref 98–111)
Creatinine, Ser: 0.92 mg/dL (ref 0.61–1.24)
GFR calc Af Amer: >60 mL/min (ref >60)
GFR calc non Af Amer: >60 mL/min (ref >60)
Glucose, Bld: 93 mg/dL (ref 70–99)
Potassium: 4 mmol/L (ref 3.5–5.1)
Sodium: 138 mmol/L (ref 135–145)
Total Bilirubin: 0.6 mg/dL (ref 0.3–1.2)
Total Protein: 8.3 g/dL — ABNORMAL HIGH (ref 6.5–8.1)

## 2018-10-07 MED ORDER — AZITHROMYCIN 250 MG PO TABS: ORAL_TABLET | ORAL | 0 refills | Status: AC

## 2018-10-07 NOTE — ED Notes (Signed)
Pt going to take more tylenol when d/c.

## 2018-10-07 NOTE — ED Triage Notes (Signed)
Diagnosed with COVID 6 days ago. Increasing SOB, diaphoretic in triage.

## 2018-10-07 NOTE — ED Provider Notes (Signed)
[facility] Emergency Department Provider Note   ____________________________________________    I have reviewed the triage vital signs and the nursing notes.   HISTORY  Chief Complaint Shortness of Breath     HPI Dennis Santiago is a 43 y.o. male with diagnosed COVID-19 6 days ago who presents with cough and fever.  He reports health department nurse told him to come be evaluated because he is having productive cough and continued fever.  He denies shortness of breath.  Has been taking over-the-counter medications.  Was told that he may need an antibiotic.  He states "I do not feel that bad "  Past Medical History:  Diagnosis Date   Hypertension     There are no active problems to display for this patient.   Past Surgical History:  Procedure Laterality Date   NOSE SURGERY      Prior to Admission medications   Medication Sig Start Date End Date Taking? Authorizing Provider  azithromycin (ZITHROMAX Z-PAK) 250 MG tablet Take 2 tablets (500 mg) on  Day 1,  followed by 1 tablet (250 mg) once daily on Days 2 through 5. 10/07/18 10/12/18  Lavonia Drafts, MD  benzonatate (TESSALON) 100 MG capsule Take 1 capsule (100 mg total) by mouth 2 (two) times daily as needed for cough. 10/01/18   Kennyth Arnold, FNP  fluticasone (FLONASE) 50 MCG/ACT nasal spray Place 2 sprays into both nostrils daily. 10/01/18   Dutch Quint B, FNP  oxyCODONE-acetaminophen (PERCOCET/ROXICET) 5-325 MG tablet Take 1-2 tablets by mouth every 6 (six) hours as needed for severe pain. 08/06/17   Nena Polio, MD  penicillin v potassium (VEETID) 500 MG tablet Take 1 tablet (500 mg total) by mouth 4 (four) times daily. 08/06/17   Nena Polio, MD  ranitidine (ZANTAC) 150 MG tablet Take 1 tablet (150 mg total) by mouth 2 (two) times daily. 03/17/16   Loney Hering, MD  sucralfate (CARAFATE) 1 g tablet Take 1 tablet (1 g total) by mouth 2 (two) times daily. 03/17/16   Loney Hering,  MD     Allergies Patient has no known allergies.  No family history on file.  Social History Social History   Tobacco Use   Smoking status: Never Smoker   Smokeless tobacco: Never Used  Substance Use Topics   Alcohol use: Yes    Comment: socially   Drug use: Never    Review of Systems  Constitutional: Positive fever Eyes: No visual changes.  ENT: No sore throat. Cardiovascular: Denies chest pain. Respiratory: Productive cough Gastrointestinal: No abdominal pain.  No nausea, no vomiting.   Genitourinary: Negative for dysuria. Musculoskeletal: Negative for back pain. Skin: Negative for rash. Neurological: Negative for headaches or weakness   ____________________________________________   PHYSICAL EXAM:  VITAL SIGNS: ED Triage Vitals  Enc Vitals Group     BP 10/07/18 1100 102/83     Pulse Rate 10/07/18 1100 97     Resp 10/07/18 1100 20     Temp 10/07/18 1100 100.1 F (37.8 C)     Temp Source 10/07/18 1100 Oral     SpO2 10/07/18 1100 97 %     Weight 10/07/18 1101 95.7 kg (211 lb)     Height 10/07/18 1101 1.676 m (5\' 6" )     Head Circumference --      Peak Flow --      Pain Score 10/07/18 1100 6     Pain Loc --  Pain Edu? --      Excl. in GC? --     Constitutional: Alert and oriented.    Nose: No congestion/rhinnorhea. Mouth/Throat: Mucous membranes are moist.    Cardiovascular: Normal rate, regular rhythm.   Good peripheral circulation. Respiratory: Normal respiratory effort.  No retractions. Gastrointestinal: Soft and nontender. No distention.  No CVA tenderness.  Musculoskeletal: No lower extremity tenderness nor edema.  Warm and well perfused Neurologic:  Normal speech and language. No gross focal neurologic deficits are appreciated.  Skin:  Skin is warm, dry and intact. No rash noted. Psychiatric: Mood and affect are normal. Speech and behavior are normal.  ____________________________________________   LABS (all labs ordered are  listed, but only abnormal results are displayed)  Labs Reviewed  COMPREHENSIVE METABOLIC PANEL - Abnormal; Notable for the following components:      Result Value   Calcium 8.7 (*)    Total Protein 8.3 (*)    All other components within normal limits  CBC WITH DIFFERENTIAL/PLATELET  TRIGLYCERIDES   ____________________________________________  EKG  ED ECG REPORT I, Jene Everyobert Ulas Zuercher, the attending physician, personally viewed and interpreted this ECG.  Date: 10/07/2018  Rhythm: normal sinus rhythm QRS Axis: normal Intervals: normal ST/T Wave abnormalities: normal Narrative Interpretation: no evidence of acute ischemia  ____________________________________________  RADIOLOGY  Small area of infiltrate consistent with early pneumonia left base ____________________________________________   PROCEDURES  Procedure(s) performed: No  Procedures   Critical Care performed: No ____________________________________________   INITIAL IMPRESSION / ASSESSMENT AND PLAN / ED COURSE  Pertinent labs & imaging results that were available during my care of the patient were reviewed by me and considered in my medical decision making (see chart for details).  Patient overall well-appearing and in no acute distress, vital signs are quite reassuring, he denies shortness of breath.  Chest x-ray demonstrates small area of infiltrate.  Pending labs, anticipate discharge patient is quarantining at home and followed by health department    ____________________________________________   FINAL CLINICAL IMPRESSION(S) / ED DIAGNOSES  Final diagnoses:  COVID-19  Viral pneumonia        Note:  This document was prepared using Dragon voice recognition software and may include unintentional dictation errors.   Jene EveryKinner, Reynaldo Rossman, MD 10/07/18 [phone]

## 2018-10-07 NOTE — ED Notes (Signed)
Pt given water and a blanket. Pt talking on the phone in NAD.

## 2019-09-05 ENCOUNTER — Other Ambulatory Visit: Payer: Self-pay

## 2019-09-05 ENCOUNTER — Telehealth (INDEPENDENT_AMBULATORY_CARE_PROVIDER_SITE_OTHER): Payer: Self-pay | Admitting: Gastroenterology

## 2019-09-05 DIAGNOSIS — Z1211 Encounter for screening for malignant neoplasm of colon: Secondary | ICD-10-CM

## 2019-09-05 NOTE — Progress Notes (Signed)
Gastroenterology Pre-Procedure Review  Request Date: Friday 10/19/19 Requesting Physician: Dr. Tobi Bastos  PATIENT REVIEW QUESTIONS: The patient responded to the following health history questions as indicated:    1. Are you having any GI issues? no 2. Do you have a personal history of Polyps? no 3. Do you have a family history of Colon Cancer or Polyps? unsure if grandmother had colon cancer, however pt states grandmother had some form of Cancer 4. Diabetes Mellitus? no 5. Joint replacements in the past 12 months?no 6. Major health problems in the past 3 months?No-patient had COVID 07/011/20. Has been vaccinated. 7. Any artificial heart valves, MVP, or defibrillator?no    MEDICATIONS & ALLERGIES:    Patient reports the following regarding taking any anticoagulation/antiplatelet therapy:   Plavix, Coumadin, Eliquis, Xarelto, Lovenox, Pradaxa, Brilinta, or Effient? no Aspirin? no  Patient confirms/reports the following medications:  Current Outpatient Medications  Medication Sig Dispense Refill  . benzonatate (TESSALON) 100 MG capsule Take 1 capsule (100 mg total) by mouth 2 (two) times daily as needed for cough. 20 capsule 0  . fluticasone (FLONASE) 50 MCG/ACT nasal spray Place 2 sprays into both nostrils daily. 16 g 0   No current facility-administered medications for this visit.    Patient confirms/reports the following allergies:  Allergies  Allergen Reactions  . Other Nausea And Vomiting    No orders of the defined types were placed in this encounter.   AUTHORIZATION INFORMATION Primary Insurance: 1D#: Group #:  Secondary Insurance: 1D#: Group #:  SCHEDULE INFORMATION: Date: Friday 07/223/21 Time: Location:[facility]

## 2019-10-17 ENCOUNTER — Other Ambulatory Visit: Admission: RE | Admit: 2019-10-17 | Payer: Commercial Managed Care - PPO | Source: Ambulatory Visit

## 2019-10-19 ENCOUNTER — Encounter: Admission: RE | Payer: Self-pay | Source: Home / Self Care

## 2019-10-19 ENCOUNTER — Ambulatory Visit
Admission: RE | Admit: 2019-10-19 | Payer: Commercial Managed Care - PPO | Source: Home / Self Care | Admitting: Gastroenterology

## 2019-10-19 SURGERY — COLONOSCOPY WITH PROPOFOL
Anesthesia: General

## 2021-04-22 ENCOUNTER — Emergency Department
Admission: EM | Admit: 2021-04-22 | Discharge: 2021-04-22 | Disposition: A | Discharge status: Home / Self Care | Payer: Managed Care, Other (non HMO) | Attending: Emergency Medicine | Admitting: Emergency Medicine

## 2021-04-22 ENCOUNTER — Other Ambulatory Visit: Payer: Self-pay

## 2021-04-22 DIAGNOSIS — S61211A Laceration without foreign body of left index finger without damage to nail, initial encounter: Secondary | ICD-10-CM | POA: Diagnosis not present

## 2021-04-22 DIAGNOSIS — Y9289 Other specified places as the place of occurrence of the external cause: Secondary | ICD-10-CM | POA: Diagnosis not present

## 2021-04-22 DIAGNOSIS — W260XXA Contact with knife, initial encounter: Secondary | ICD-10-CM | POA: Diagnosis not present

## 2021-04-22 DIAGNOSIS — Y9389 Activity, other specified: Secondary | ICD-10-CM | POA: Diagnosis not present

## 2021-04-22 DIAGNOSIS — S60941A Unspecified superficial injury of left index finger, initial encounter: Secondary | ICD-10-CM | POA: Diagnosis present

## 2021-04-22 DIAGNOSIS — Y990 Civilian activity done for income or pay: Secondary | ICD-10-CM | POA: Insufficient documentation

## 2021-04-22 MED ORDER — LIDOCAINE HCL (PF) 1 % IJ SOLN
5.0000 mL | Freq: Once | INTRAMUSCULAR | Status: AC
  Administered 2021-04-22: 21:00:00 5 mL via INTRADERMAL
  Filled 2021-04-22: qty 5

## 2021-04-22 MED ORDER — BUPIVACAINE HCL (PF) 0.5 % IJ SOLN
10.0000 mL | Freq: Once | INTRAMUSCULAR | Status: AC
  Administered 2021-04-22: 21:00:00 10 mL
  Filled 2021-04-22: qty 10

## 2021-04-22 NOTE — ED Provider Notes (Signed)
[facility] Provider Note    Event Date/Time   First MD Initiated Contact with Patient 04/22/21 2108     (approximate)   History   Laceration   HPI  Dennis Santiago is a 46 y.o. male with no significant past medical history presents to the emergency department after cutting his left index finger with a clean knife while at work.  Tdap is within the past 5 years.  Bleeding well controlled.     Physical Exam   Triage Vital Signs: ED Triage Vitals  Enc Vitals Group     BP 04/22/21 2016 (!) 148/81     Pulse Rate 04/22/21 2016 84     Resp 04/22/21 2016 17     Temp 04/22/21 2016 98.7 F (37.1 C)     Temp Source 04/22/21 2016 Oral     SpO2 04/22/21 2016 97 %     Weight 04/22/21 2017 231 lb (104.8 kg)     Height 04/22/21 2017 5\' 6"  (1.676 m)     Head Circumference --      Peak Flow --      Pain Score 04/22/21 2017 0     Pain Loc --      Pain Edu? --      Excl. in GC? --     Most recent vital signs: Vitals:   04/22/21 2016  BP: (!) 148/81  Pulse: 84  Resp: 17  Temp: 98.7 F (37.1 C)  SpO2: 97%     General: Awake, no distress.  CV:  Good peripheral perfusion.  Resp:  Normal effort.  Abd:  No distention.  Other:  4cm laceration to left index finger   ED Results / Procedures / Treatments   Labs (all labs ordered are listed, but only abnormal results are displayed) Labs Reviewed - No data to display   EKG  Not indicated.   RADIOLOGY Not indicated.   PROCEDURES:  Critical Care performed: No  ..Laceration Repair  Date/Time: 04/23/2021 6:58 PM Performed by: 04/25/2021, FNP Authorized by: Chinita Pester, FNP   Consent:    Consent obtained:  Verbal   Consent given by:  Patient   Risks discussed:  Infection, pain, poor cosmetic result and poor wound healing Universal protocol:    Patient identity confirmed:  Verbally with patient Anesthesia:    Anesthesia method:  Nerve block   Block needle gauge:  25 G   Block  anesthetic:  Lidocaine 1% w/o epi and bupivacaine 0.5% w/o epi   Block injection procedure:  Anatomic landmarks identified   Block outcome:  Anesthesia achieved Laceration details:    Location:  Finger   Finger location:  L index finger   Length (cm):  4 Pre-procedure details:    Preparation:  Patient was prepped and draped in usual sterile fashion Exploration:    Contaminated: no   Treatment:    Area cleansed with:  Povidone-iodine and saline   Irrigation method:  Syringe Skin repair:    Repair method:  Sutures   Suture size:  5-0   Suture material:  Nylon   Suture technique:  Simple interrupted   Number of sutures:  6 Approximation:    Approximation:  Close Repair type:    Repair type:  Simple Post-procedure details:    Dressing:  Antibiotic ointment and non-adherent dressing   Procedure completion:  Tolerated well, no immediate complications   MEDICATIONS ORDERED IN ED: Medications  lidocaine (PF) (XYLOCAINE) 1 % injection 5 mL (  5 mLs Intradermal Given by Other 04/22/21 2119)  bupivacaine (MARCAINE) 0.5 % injection 10 mL (10 mLs Infiltration Given 04/22/21 2120)     IMPRESSION / MDM / ASSESSMENT AND PLAN / ED COURSE  I reviewed the triage vital signs and the nursing notes.                              Differential diagnosis includes, but is not limited to, skin laceration, nailbed laceration, tendon injury  46 year old male presents to the emergency department for treatment and evaluation of laceration to the left index finger sustained while at work.  Patient states that the knife that he cut his finger with was clean.  He is able to perform flexion and extension.  No nailbed laceration.  Wound was cleaned and repaired as described above.  Patient tolerated the procedure well.  Tdap was given approximately 5 years ago after he had sustained a burn.  Wound care instructions were discussed with the patient.  When he returns to work, he is aware that he will need to keep  the wound clean and dry.  Risk of infection discussed.  No need for empiric antibiotic today since the knife was clean.  He was advised to have the sutures removed in approximately 10 days.  He was advised to be seen sooner for any sign or concern of infection.      FINAL CLINICAL IMPRESSION(S) / ED DIAGNOSES   Final diagnoses:  Laceration of left index finger without foreign body without damage to nail, initial encounter     Rx / DC Orders   ED Discharge Orders     None        Note:  This document was prepared using Dragon voice recognition software and may include unintentional dictation errors.   Chinita Pester, FNP 04/23/21 1903    Gilles Chiquito, MD 04/23/21 1911

## 2021-04-22 NOTE — Discharge Instructions (Signed)
Do not get the sutured area wet for 24 hours. After 24 hours, shower/bathe as usual and pat the area dry. °Change the bandage 2 times per day and apply antibiotic ointment. °Leave open to air when at no risk of getting the area dirty, but cover at night before bed. °See your PCP or go to Urgent Care in 10 days for suture removal or sooner for signs or concern of infection. ° °

## 2021-04-22 NOTE — ED Notes (Signed)
Dressing applied to index finger.  D/c inst to pt.

## 2021-04-22 NOTE — ED Triage Notes (Signed)
Pt presents to ER from work.  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  Bleeding under control at this time.  No distress noted in triage.

## 2021-05-01 IMAGING — DX PORTABLE CHEST - 1 VIEW
1 series · 1 of 1 positions shown · non-contrast
Comparison: May 20, 2016

CLINICAL DATA: Cough and fever.  [id number] positive

EXAM:
PORTABLE CHEST 1 VIEW

[chest ap]
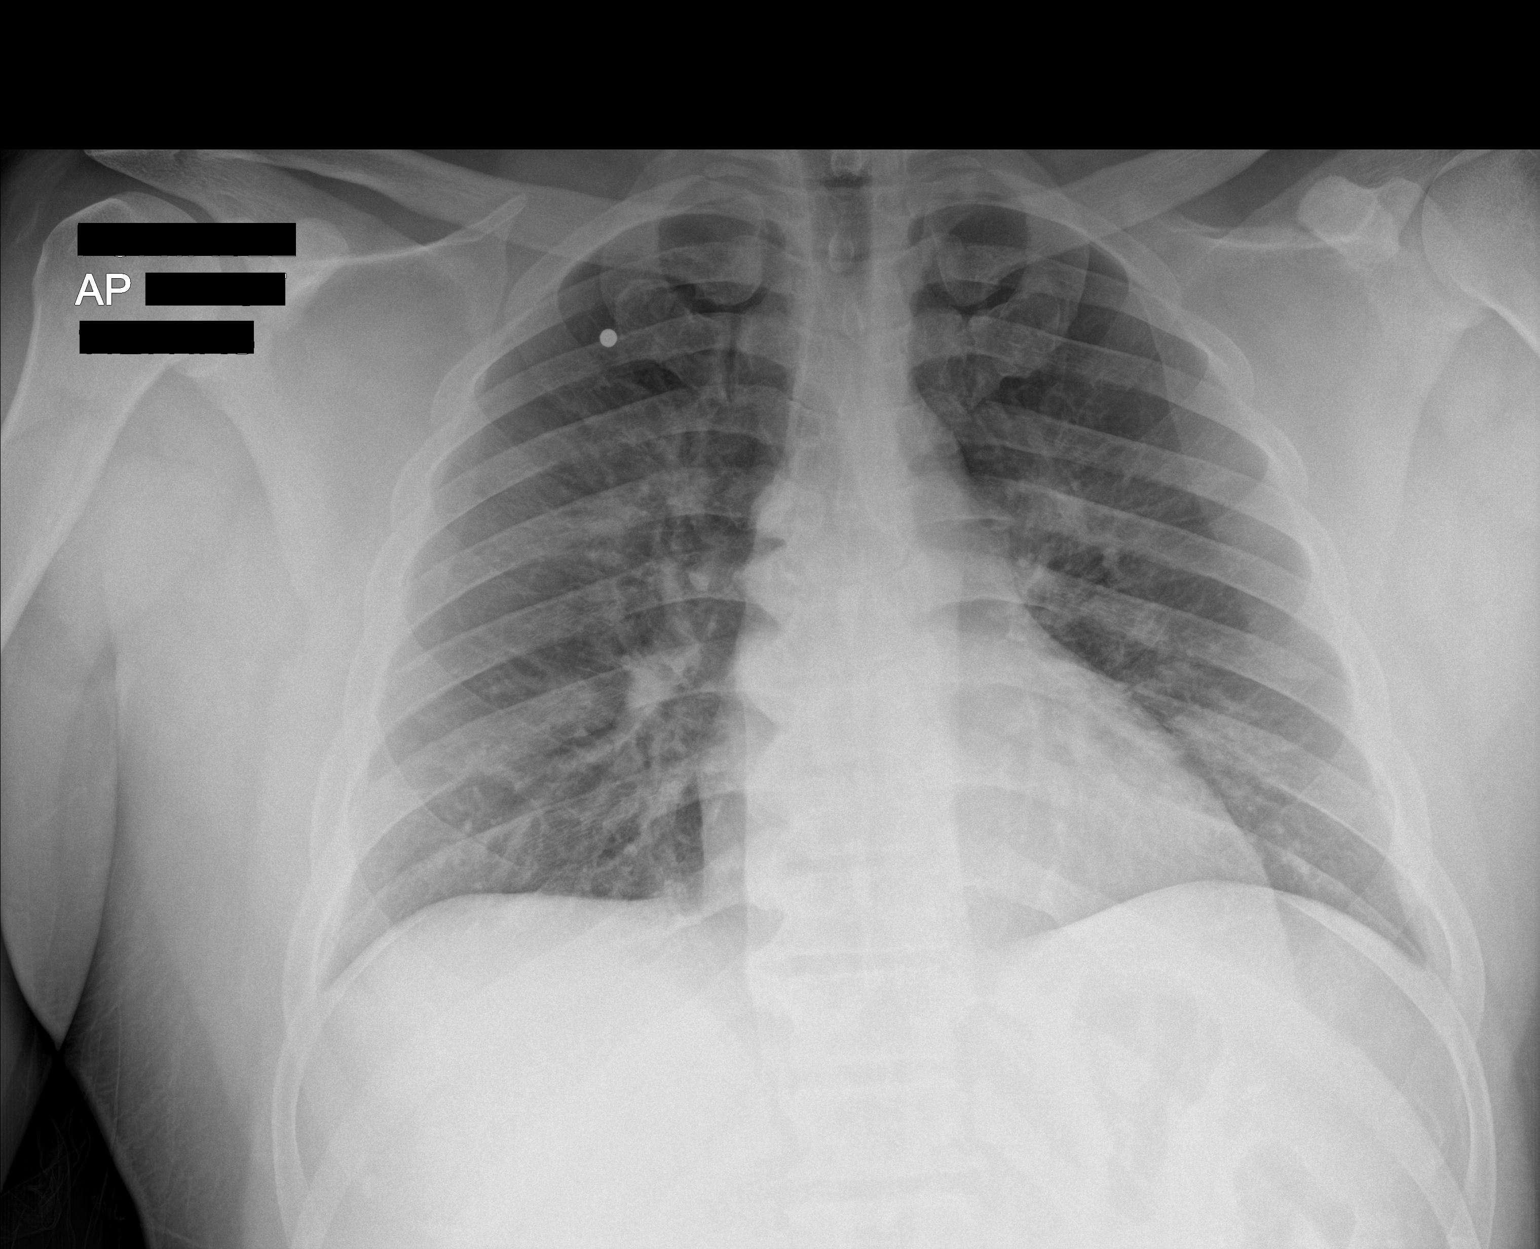

[1 of 1 positions shown; findings below may reference images not displayed]

FINDINGS: There is a small area of airspace consolidation in the left base,
suspicious for early pneumonia. Lungs elsewhere are clear. Heart
size and pulmonary vascularity are normal. No adenopathy. There is
degenerative change in the thoracic spine. There is a rounded
radiopaque foreign body in the right upper hemithorax, stable.
IMPRESSION: Small area of infiltrate consistent with early pneumonia left base.
Lungs elsewhere clear. Stable cardiac silhouette. No adenopathy
evident.

## 2022-09-15 ENCOUNTER — Other Ambulatory Visit: Payer: Self-pay

## 2022-09-15 ENCOUNTER — Telehealth: Payer: Self-pay

## 2022-09-15 DIAGNOSIS — Z1211 Encounter for screening for malignant neoplasm of colon: Secondary | ICD-10-CM

## 2022-09-15 MED ORDER — NA SULFATE-K SULFATE-MG SULF 17.5-3.13-1.6 GM/177ML PO SOLN: 1.0000 | Freq: Once | ORAL | 0 refills | Status: AC

## 2022-09-15 NOTE — Telephone Encounter (Signed)
Gastroenterology Pre-Procedure Review  Request Date: 11/08/22 Requesting Physician: Dr. Tobi Bastos  PATIENT REVIEW QUESTIONS: The patient responded to the following health history questions as indicated:    1. Are you having any GI issues? no 2. Do you have a personal history of Polyps? no 3. Do you have a family history of Colon Cancer or Polyps? no unsure cancer runs in the family but not certain what kind 4. Diabetes Mellitus? no 5. Joint replacements in the past 12 months?no 6. Major health problems in the past 3 months?no 7. Any artificial heart valves, MVP, or defibrillator?no    MEDICATIONS & ALLERGIES:    Patient reports the following regarding taking any anticoagulation/antiplatelet therapy:   Plavix, Coumadin, Eliquis, Xarelto, Lovenox, Pradaxa, Brilinta, or Effient? no Aspirin? no  Patient confirms/reports the following medications:  Current Outpatient Medications  Medication Sig Dispense Refill   benzonatate (TESSALON) 100 MG capsule Take 1 capsule (100 mg total) by mouth 2 (two) times daily as needed for cough. 20 capsule 0   fluticasone (FLONASE) 50 MCG/ACT nasal spray Place 2 sprays into both nostrils daily. 16 g 0   No current facility-administered medications for this visit.    Patient confirms/reports the following allergies:  Allergies  Allergen Reactions   Other Nausea And Vomiting    No orders of the defined types were placed in this encounter.   AUTHORIZATION INFORMATION Primary Insurance: 1D#: Group #:  Secondary Insurance: 1D#: Group #:  SCHEDULE INFORMATION: Date: 11/08/22  Time: Location: [facility]

## 2022-11-01 ENCOUNTER — Telehealth: Payer: Self-pay

## 2022-11-01 DIAGNOSIS — Z1211 Encounter for screening for malignant neoplasm of colon: Secondary | ICD-10-CM

## 2022-11-01 MED ORDER — GOLYTELY 236 G PO SOLR: 4000.0000 mL | Freq: Once | ORAL | 0 refills | Status: AC

## 2022-11-01 NOTE — Telephone Encounter (Signed)
Patient instructions not completed.  Instructions sent via mychart.  Thanks,  [place], [place]

## 2022-11-05 ENCOUNTER — Encounter: Payer: Self-pay | Admitting: Gastroenterology

## 2022-11-08 ENCOUNTER — Encounter: Payer: Self-pay | Admitting: Gastroenterology

## 2022-11-08 ENCOUNTER — Other Ambulatory Visit: Payer: Self-pay

## 2022-11-08 ENCOUNTER — Ambulatory Visit: Payer: Medicaid Other | Admitting: Anesthesiology

## 2022-11-08 ENCOUNTER — Ambulatory Visit
Admission: RE | Admit: 2022-11-08 | Discharge: 2022-11-08 | Disposition: A | Discharge status: Home / Self Care | Payer: Medicaid Other | Attending: Gastroenterology | Admitting: Gastroenterology

## 2022-11-08 ENCOUNTER — Encounter
Admission: RE | Disposition: A | Discharge status: Home / Self Care | Payer: Self-pay | Source: Home / Self Care | Attending: Gastroenterology

## 2022-11-08 DIAGNOSIS — K621 Rectal polyp: Secondary | ICD-10-CM | POA: Insufficient documentation

## 2022-11-08 DIAGNOSIS — D126 Benign neoplasm of colon, unspecified: Secondary | ICD-10-CM | POA: Diagnosis not present

## 2022-11-08 DIAGNOSIS — D122 Benign neoplasm of ascending colon: Secondary | ICD-10-CM | POA: Insufficient documentation

## 2022-11-08 DIAGNOSIS — I10 Essential (primary) hypertension: Secondary | ICD-10-CM | POA: Diagnosis not present

## 2022-11-08 DIAGNOSIS — Z1211 Encounter for screening for malignant neoplasm of colon: Secondary | ICD-10-CM | POA: Insufficient documentation

## 2022-11-08 HISTORY — PX: POLYPECTOMY: SHX5525

## 2022-11-08 HISTORY — PX: COLONOSCOPY WITH PROPOFOL: SHX5780

## 2022-11-08 SURGERY — COLONOSCOPY WITH PROPOFOL
Anesthesia: General

## 2022-11-08 MED ORDER — SODIUM CHLORIDE 0.9 % IV SOLN: INTRAVENOUS | Status: DC

## 2022-11-08 MED ORDER — PROPOFOL 10 MG/ML IV BOLUS
INTRAVENOUS | Status: DC | PRN
Start: 2022-11-08 — End: 2022-11-08
  Administered 2022-11-08: 60 mg via INTRAVENOUS

## 2022-11-08 MED ORDER — LIDOCAINE HCL (CARDIAC) PF 100 MG/5ML IV SOSY
PREFILLED_SYRINGE | INTRAVENOUS | Status: DC | PRN
  Administered 2022-11-08: 50 mg via INTRAVENOUS

## 2022-11-08 MED ORDER — PROPOFOL 500 MG/50ML IV EMUL
INTRAVENOUS | Status: DC | PRN
  Administered 2022-11-08: 150 ug/kg/min via INTRAVENOUS

## 2022-11-08 NOTE — H&P (Signed)
     Wyline Mood, MD [address], [place], [place], [address] [address], [address], [place], [place], [address] Phone: [phone]  Fax: [phone]  Primary Care Physician:  Loistine Chance, MD   Pre-Procedure History & Physical: HPI:  Dennis Santiago is a 47 y.o. male is here for an colonoscopy.   Past Medical History:  Diagnosis Date   Hypertension     Past Surgical History:  Procedure Laterality Date   NOSE SURGERY      Prior to Admission medications   Medication Sig Start Date End Date Taking? Authorizing Provider  amLODipine (NORVASC) 5 MG tablet Take 1 tablet by mouth daily. 08/24/19 09/06/23 Yes [provider]  benzonatate (TESSALON) 100 MG capsule Take 1 capsule (100 mg total) by mouth 2 (two) times daily as needed for cough. 10/01/18   Eulis Foster, FNP  fluticasone (FLONASE) 50 MCG/ACT nasal spray Place 2 sprays into both nostrils daily. 10/01/18   Worthy Rancher B, FNP    Allergies as of 09/15/2022 - Review Complete 04/22/2021  Allergen Reaction Noted   Other Nausea And Vomiting 06/30/2015   Shellfish allergy Nausea And Vomiting 06/30/2015    History reviewed. No pertinent family history.  Social History   Socioeconomic History   Marital status: Married    Spouse name: Not on file   Number of children: Not on file   Years of education: Not on file   Highest education level: Not on file  Occupational History   Not on file  Tobacco Use   Smoking status: Never   Smokeless tobacco: Never  Vaping Use   Vaping status: Never Used  Substance and Sexual Activity   Alcohol use: Yes    Comment: socially   Drug use: Never   Sexual activity: Not on file  Other Topics Concern   Not on file  Social History Narrative   Not on file   Social Determinants of Health   Financial Resource Strain: Not on file  Food Insecurity: Not on file  Transportation Needs: No Transportation Needs (09/06/2022)   [facility] from [facility]   PRAPARE - Transportation    Lack of Transportation (Medical): No    Lack of Transportation (Non-Medical): No  Physical Activity: Not on file  Stress: Not on file  Social Connections: Not on file  Intimate Partner Violence: Not on file    Review of Systems: See HPI, otherwise negative ROS  Physical Exam: BP (!) 151/91   Pulse 70   Temp (!) 96.3 F (35.7 C) (Temporal)   Resp 20   Ht 5\' 6"  (1.676 m)   Wt 104.1 kg   SpO2 98%   BMI 37.06 kg/m  General:   Alert,  pleasant and cooperative in NAD Head:  Normocephalic and atraumatic. Neck:  Supple; no masses or thyromegaly. Lungs:  Clear throughout to auscultation, normal respiratory effort.    Heart:  +S1, +S2, Regular rate and rhythm, No edema. Abdomen:  Soft, nontender and nondistended. Normal bowel sounds, without guarding, and without rebound.   Neurologic:  Alert and  oriented x4;  grossly normal neurologically.  Impression/Plan: Dennis Santiago is here for an colonoscopy to be performed for Screening colonoscopy average risk   Risks, benefits, limitations, and alternatives regarding  colonoscopy have been reviewed with the patient.  Questions have been answered.  All parties agreeable.   Wyline Mood, MD  11/08/2022, 10:22 AM

## 2022-11-08 NOTE — Anesthesia Preprocedure Evaluation (Signed)
Anesthesia Evaluation  Patient identified by MRN, date of birth, ID band Patient awake    Reviewed: Allergy & Precautions, NPO status , Patient's Chart, lab work & pertinent test results  History of Anesthesia Complications Negative for: history of anesthetic complications  Airway Mallampati: III  TM Distance: >3 FB Neck ROM: full    Dental  (+) Chipped   Pulmonary neg pulmonary ROS, neg shortness of breath   Pulmonary exam normal        Cardiovascular Exercise Tolerance: Good hypertension, (-) angina Normal cardiovascular exam     Neuro/Psych negative neurological ROS  negative psych ROS   GI/Hepatic negative GI ROS, Neg liver ROS,neg GERD  ,,  Endo/Other  negative endocrine ROS    Renal/GU negative Renal ROS  negative genitourinary   Musculoskeletal   Abdominal   Peds  Hematology negative hematology ROS (+)   Anesthesia Other Findings Past Medical History: No date: Hypertension  Past Surgical History: No date: NOSE SURGERY  BMI    Body Mass Index: 37.06 kg/m      Reproductive/Obstetrics negative OB ROS                             Anesthesia Physical Anesthesia Plan  ASA: 2  Anesthesia Plan: General   Post-op Pain Management:    Induction: Intravenous  PONV Risk Score and Plan: Propofol infusion and TIVA  Airway Management Planned: Natural Airway and Nasal Cannula  Additional Equipment:   Intra-op Plan:   Post-operative Plan:   Informed Consent: I have reviewed the patients History and Physical, chart, labs and discussed the procedure including the risks, benefits and alternatives for the proposed anesthesia with the patient or authorized representative who has indicated his/her understanding and acceptance.     Dental Advisory Given  Plan Discussed with: Anesthesiologist, CRNA and Surgeon  Anesthesia Plan Comments: (Patient consented for risks of anesthesia  including but not limited to:  - adverse reactions to medications - risk of airway placement if required - damage to eyes, teeth, lips or other oral mucosa - nerve damage due to positioning  - sore throat or hoarseness - Damage to heart, brain, nerves, lungs, other parts of body or loss of life  Patient voiced understanding.)       Anesthesia Quick Evaluation

## 2022-11-08 NOTE — Anesthesia Procedure Notes (Signed)
Date/Time: 11/08/2022 10:21 AM  Performed by: Elmarie Mainland, CRNAPre-anesthesia Checklist: Patient identified, Emergency Drugs available, Suction available and Patient being monitored Patient Re-evaluated:Patient Re-evaluated prior to induction Oxygen Delivery Method: Nasal cannula

## 2022-11-08 NOTE — Anesthesia Postprocedure Evaluation (Signed)
Anesthesia Post Note  Patient: Dennis Santiago  Procedure(s) Performed: COLONOSCOPY WITH PROPOFOL POLYPECTOMY  Patient location during evaluation: Endoscopy Anesthesia Type: General Level of consciousness: awake and alert Pain management: pain level controlled Vital Signs Assessment: post-procedure vital signs reviewed and stable Respiratory status: spontaneous breathing, nonlabored ventilation, respiratory function stable and patient connected to nasal cannula oxygen Cardiovascular status: blood pressure returned to baseline and stable Postop Assessment: no apparent nausea or vomiting Anesthetic complications: no   No notable events documented.   Last Vitals:  Vitals:   11/08/22 0939 11/08/22 1043  BP: (!) 151/91 106/70  Pulse: 70 73  Resp: 20 20  Temp: (!) 35.7 C (!) 35.6 C  SpO2: 98% 96%    Last Pain:  Vitals:   11/08/22 1103  TempSrc:   PainSc: 0-No pain                 Cleda Mccreedy Clarita Mcelvain

## 2022-11-08 NOTE — Op Note (Signed)
[facility] Patient Name: Dennis Santiago Procedure Date: 11/08/2022 10:14 AM MRN: [id number] Account #: [id number] Date of Birth: 1975/04/18 Admit Type: Outpatient Age: 47 Room: [facility] ENDO ROOM 1 Gender: Male Note Status: Finalized Instrument Name: Prentice Docker [id number] Procedure:             Colonoscopy Indications:           Screening for colorectal malignant neoplasm Providers:             Wyline Mood MD, MD Medicines:             Monitored Anesthesia Care Complications:         No immediate complications. Procedure:             Pre-Anesthesia Assessment:                        - Prior to the procedure, a History and Physical was                         performed, and patient medications, allergies and                         sensitivities were reviewed. The patient's tolerance                         of previous anesthesia was reviewed.                        - The risks and benefits of the procedure and the                         sedation options and risks were discussed with the                         patient. All questions were answered and informed                         consent was obtained.                        - After reviewing the risks and benefits, the patient                         was deemed in satisfactory condition to undergo the                         procedure.                        - ASA Grade Assessment: II - A patient with mild                         systemic disease.                        After obtaining informed consent, the colonoscope was                         passed under direct vision. Throughout the procedure,  the patient's blood pressure, pulse, and oxygen                         saturations were monitored continuously. The                         Colonoscope was introduced through the anus and                         advanced to the the cecum, identified by the                          appendiceal orifice. The colonoscopy was performed                         with ease. The patient tolerated the procedure well.                         The quality of the bowel preparation was good. The                         ileocecal valve, appendiceal orifice, and rectum were                         photographed. Findings:      The perianal and digital rectal examinations were normal.      Two sessile polyps were found in the rectum and ascending colon. The       polyps were 4 to 5 mm in size. These polyps were removed with a cold       snare. Resection and retrieval were complete.      The exam was otherwise without abnormality on direct and retroflexion       views. Impression:            - Two 4 to 5 mm polyps in the rectum and in the                         ascending colon, removed with a cold snare. Resected                         and retrieved.                        - The examination was otherwise normal on direct and                         retroflexion views. Recommendation:        - Discharge patient to home (with escort).                        - Resume previous diet.                        - Continue present medications.                        - Await pathology results.                        - Repeat colonoscopy  date to be determined after                         pending pathology results are reviewed for screening                         purposes. Procedure Code(s):     --- Professional ---                        [phone], Colonoscopy, flexible; with removal of                         tumor(s), polyp(s), or other lesion(s) by snare                         technique Diagnosis Code(s):     --- Professional ---                        Z12.11, Encounter for screening for malignant neoplasm                         of colon                        D12.8, Benign neoplasm of rectum                        D12.2, Benign neoplasm of ascending colon CPT copyright 2022 American  Medical Association. All rights reserved. The codes documented in this report are preliminary and upon coder review may  be revised to meet current compliance requirements. Wyline Mood, MD Wyline Mood MD, MD 11/08/2022 10:41:49 AM This report has been signed electronically. Number of Addenda: 0 Note Initiated On: 11/08/2022 10:14 AM Scope Withdrawal Time: 0 hours 10 minutes 17 seconds  Total Procedure Duration: 0 hours 12 minutes 11 seconds  Estimated Blood Loss:  Estimated blood loss: none.      [facility]

## 2022-11-08 NOTE — Transfer of Care (Signed)
Immediate Anesthesia Transfer of Care Note  Patient: Dennis Santiago  Procedure(s) Performed: COLONOSCOPY WITH PROPOFOL POLYPECTOMY  Patient Location: PACU and Endoscopy Unit  Anesthesia Type:General  Level of Consciousness: awake, drowsy, and patient cooperative  Airway & Oxygen Therapy: Patient Spontanous Breathing  Post-op Assessment: Report given to RN and Post -op Vital signs reviewed and stable  Post vital signs: Reviewed and stable  Last Vitals:  Vitals Value Taken Time  BP 106/70 11/08/22 1043  Temp 35.6 C 11/08/22 1043  Pulse 74 11/08/22 1044  Resp 21 11/08/22 1044  SpO2 97 % 11/08/22 1044  Vitals shown include unfiled device data.  Last Pain:  Vitals:   11/08/22 1043  TempSrc: Temporal  PainSc: 0-No pain         Complications: No notable events documented.

## 2022-11-09 ENCOUNTER — Encounter: Payer: Self-pay | Admitting: Gastroenterology

## 2022-11-11 ENCOUNTER — Encounter: Payer: Self-pay | Admitting: Gastroenterology
